# Patient Record
Sex: Female | Born: 2000 | Race: Black or African American | Hispanic: No | Marital: Single | Smoking: Never smoker
Health system: Southern US, Community
[De-identification: ages and names within clinical notes are randomized; demographics above are authoritative.]

## PROBLEM LIST (undated history)

## (undated) DIAGNOSIS — J45909 Unspecified asthma, uncomplicated: Secondary | ICD-10-CM

## (undated) DIAGNOSIS — K602 Anal fissure, unspecified: Secondary | ICD-10-CM

## (undated) DIAGNOSIS — T7840XA Allergy, unspecified, initial encounter: Secondary | ICD-10-CM

## (undated) DIAGNOSIS — Z862 Personal history of diseases of the blood and blood-forming organs and certain disorders involving the immune mechanism: Secondary | ICD-10-CM

## (undated) DIAGNOSIS — J219 Acute bronchiolitis, unspecified: Secondary | ICD-10-CM

## (undated) HISTORY — DX: Personal history of diseases of the blood and blood-forming organs and certain disorders involving the immune mechanism: Z86.2

## (undated) HISTORY — DX: Allergy, unspecified, initial encounter: T78.40XA

## (undated) HISTORY — DX: Anal fissure, unspecified: K60.2

## (undated) HISTORY — DX: Unspecified asthma, uncomplicated: J45.909

## (undated) HISTORY — DX: Acute bronchiolitis, unspecified: J21.9

---
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
## 2021-01-02 ENCOUNTER — Other Ambulatory Visit: Payer: Self-pay | Admitting: Internal Medicine

## 2021-03-06 ENCOUNTER — Other Ambulatory Visit: Payer: Self-pay

## 2021-03-07 ENCOUNTER — Ambulatory Visit (INDEPENDENT_AMBULATORY_CARE_PROVIDER_SITE_OTHER): Payer: No Typology Code available for payment source | Admitting: Internal Medicine

## 2021-03-07 ENCOUNTER — Encounter: Payer: Self-pay | Admitting: Internal Medicine

## 2021-03-07 VITALS — BP 122/68 | HR 76 | Temp 97.3°F | Ht 64.81 in | Wt 126.4 lb

## 2021-03-07 DIAGNOSIS — R6889 Other general symptoms and signs: Secondary | ICD-10-CM

## 2021-03-07 DIAGNOSIS — R899 Unspecified abnormal finding in specimens from other organs, systems and tissues: Secondary | ICD-10-CM

## 2021-03-07 NOTE — Progress Notes (Signed)
Chief Complaint  Patient presents with  . Results    HPI: Andrea Odonnell 20 y.o. come in  With mom and younger sibs for review of lab tests that were done in June 2021 at [facility] for her sports physical. She is generally well but had some minor lab abnormalities and needed follow-up mom was concerned.  States that she is cold a lot of the time.  Last visit 2020 with me She is a senior in high school had positive Covid November 2020 and then positive earlier this year but no illness tested because she was on the team had to be quarantined for 10 days exposed to another player. Never got sick.  Also known to have had the Pfizer vaccine last April. Had covid 19 test pos 11 2020 ( family also pos)  ROS: See pertinent positives and negatives per HPI.  No chest pain shortness of breath periods are normal no unusual Sleep is good no exercise intolerance.  At this time.  Past Medical History:  Diagnosis Date  . Allergy   . Anal fissure    hx as a infant andtoddler  . ASTHMA 02/16/2009  . Bronchiolitis    hosp age 64 months   . Constipation    as an infant  gi consults  . Hx of sickle cell trait     History reviewed. No pertinent family history.  Social History   Socioeconomic History  . Marital status: Single    Spouse name: Not on file  . Number of children: Not on file  . Years of education: Not on file  . Highest education level: Not on file  Occupational History  . Not on file  Tobacco Use  . Smoking status: Never Smoker  . Smokeless tobacco: Never Used  Substance and Sexual Activity  . Alcohol use: No  . Drug use: No  . Sexual activity: Never  Other Topics Concern  . Not on file  Social History Narrative   hh of  5 + sis in collegeJasmine   [employer] 5th grade    7th grade  Does well in school    Picky eater   9th grade [employer]    Plays basket ball.    Father smokes  Outside    Social Determinants of Health   Financial Resource Strain: Not on file  Food  Insecurity: Not on file  Transportation Needs: Not on file  Physical Activity: Not on file  Stress: Not on file  Social Connections: Not on file    Outpatient Medications Prior to Visit  Medication Sig Dispense Refill  . Adapalene 0.3 % gel APPLY A SMALL AMOUNT TO AFFECTED AREA AT BEDTIME    . albuterol (VENTOLIN HFA) 108 (90 Base) MCG/ACT inhaler INHALE 2 PUFFS EVERY 6 HOURS AS NEEDED FOR WHEEZING OR 15-30 MINUTES PRIOR TO EXERCISE. 6.7 g 0  . CLINDACIN ETZ 1 % SWAB SMARTSIG:1 Swab Topical Every Morning    . mupirocin ointment (BACTROBAN) 2 % Apply to area tid 15 g 1   No facility-administered medications prior to visit.     EXAM:  BP 122/68 (BP Location: Left Arm, Patient Position: Sitting, Cuff Size: Normal)   Pulse 76   Temp (!) 97.3 F (36.3 C) (Oral)   Ht 5' 4.81" (1.646 m)   Wt 126 lb 6.4 oz (57.3 kg)   LMP 02/26/2021   SpO2 99%   BMI 21.16 kg/m   Body mass index is 21.16 kg/m.  GENERAL: vitals reviewed and listed  above, alert, oriented, appears well hydrated and in no acute distress HEENT: atraumatic, conjunctiva  clear, no obvious abnormalities on inspection of external nose and ears OP : masked NECK: no obvious masses on inspection palpation  LUNGS: clear to auscultation bilaterally, no wheezes, rales or rhonchi, good air movement CV: HRRR, no clubbing cyanosis or  peripheral edema nl cap refill  Abdomen:  Sof,t normal bowel sounds without hepatosplenomegaly, no guarding rebound or masses no CVA tenderness MS: moves all extremities without noticeable focal  abnormality PSYCH: pleasant and cooperative, no obvious depression or anxiety No results found for: WBC, HGB, HCT, PLT, GLUCOSE, CHOL, TRIG, HDL, LDLDIRECT, LDLCALC, ALT, AST, NA, K, CL, CREATININE, BUN, CO2, TSH, PSA, INR, GLUF, HGBA1C, MICROALBUR BP Readings from Last 3 Encounters:  03/07/21 122/68  11/02/16 106/60 (44 %, Z = -0.15 /  33 %, Z = -0.44)*  02/17/16 120/60   *BP percentiles are based on the  2017 AAP Clinical Practice Guideline for girls  Review of laboratory studies shows negative HIV positive antibody Covid hemoglobin 12.9 MCV 90 platelets 256 MPV 11.1 slightly elevated normal differential blood glucose 82 creatinine 0.7 liver panel normal total cholesterol 128 triglycerides 35 HDL 55 LDL 66 Lab results will be sent to scan and abstract. ASSESSMENT AND PLAN:  Discussed the following assessment and plan:  Cold intolerance - Plan: CBC with Differential/Platelet, TSH, T4, free, IBC + Ferritin, IBC + Ferritin, CBC with Differential/Platelet, TSH, T4, free  Abnormal laboratory test result Doubt significance of borderline elevated MPV 11.1 without other abnormalities Check thyroid iron levels basically her exam is normal Briefly discussed immunizations may need to be updated   -Patient advised to return or notify health care team  if  new concerns arise.  Patient Instructions   Checking for  iron levels and thyroid today  .  Can cause cold intolerance .   You may be due for second meningitis vaccine and other  HPV .     Neta Mends. Andrea Odonnell M.D.

## 2021-03-07 NOTE — Patient Instructions (Signed)
  Checking for  iron levels and thyroid today  .  Can cause cold intolerance .   You may be due for second meningitis vaccine and other  HPV .

## 2021-03-08 LAB — CBC WITH DIFFERENTIAL/PLATELET
Basophils Absolute: 0.1 10*3/uL (ref 0.0–0.1)
Basophils Relative: 1.2 % (ref 0.0–3.0)
Eosinophils Absolute: 0.1 10*3/uL (ref 0.0–0.7)
Eosinophils Relative: 3 % (ref 0.0–5.0)
HCT: 38.2 % (ref 36.0–49.0)
Hemoglobin: 12.9 g/dL (ref 12.0–16.0)
Lymphocytes Relative: 38.2 % (ref 24.0–48.0)
Lymphs Abs: 1.7 10*3/uL (ref 0.7–4.0)
MCHC: 33.7 g/dL (ref 31.0–37.0)
MCV: 88.9 fl (ref 78.0–98.0)
Monocytes Absolute: 0.4 10*3/uL (ref 0.1–1.0)
Monocytes Relative: 9.9 % (ref 3.0–12.0)
Neutro Abs: 2.1 10*3/uL (ref 1.4–7.7)
Neutrophils Relative %: 47.7 % (ref 43.0–71.0)
Platelets: 244 10*3/uL (ref 150.0–575.0)
RBC: 4.3 Mil/uL (ref 3.80–5.70)
RDW: 14.5 % (ref 11.4–15.5)
WBC: 4.4 10*3/uL — ABNORMAL LOW (ref 4.5–13.5)

## 2021-03-08 LAB — T4, FREE: Free T4: 0.86 ng/dL (ref 0.60–1.60)

## 2021-03-08 LAB — IBC + FERRITIN
Ferritin: 14.7 ng/mL (ref 10.0–291.0)
Iron: 32 ug/dL — ABNORMAL LOW (ref 42–145)
Saturation Ratios: 6.5 % — ABNORMAL LOW (ref 20.0–50.0)
Transferrin: 352 mg/dL (ref 212.0–360.0)

## 2021-03-08 LAB — TSH: TSH: 1.4 u[IU]/mL (ref 0.40–5.00)

## 2021-03-08 NOTE — Progress Notes (Signed)
Repeat blood count is normal but iron levels are slightly low so you do have iron deficiency thyroid is normal.  You need to take an iron pill supplement every day as tolerated ferrous sulfate is the most common type of supplement.Iif it bothers your stomach take it every other day.  I would do this for 2 to 3 months and if not feeling better will do another lab test.  Otherwise can stop after 3 months and take just during periods.

## 2021-05-17 ENCOUNTER — Encounter: Payer: Self-pay | Admitting: Internal Medicine

## 2021-06-07 ENCOUNTER — Telehealth: Payer: Self-pay | Admitting: Internal Medicine

## 2021-06-07 NOTE — Telephone Encounter (Signed)
Patient mother is calling and wanted to see if provider can write a letter stating that patient has a sickle cell trait and does not need to restest again at school to participate in sports. Mother is aware that provider is out of the office and letter may or may not be written until she returns. CB is [phone]

## 2021-06-11 NOTE — Telephone Encounter (Signed)
Either give mom a copy of the sickle trait  lab results  if you can find it  in the record  or can   write her a letter  that says she has has sickle trait     It appears she has had a sports physical with another provider recently .  At urgent care   they could also write a letter .  If needed

## 2021-06-12 ENCOUNTER — Encounter: Payer: Self-pay | Admitting: Internal Medicine

## 2021-06-12 NOTE — Telephone Encounter (Signed)
I spoke with the patient's mother and she stated she needed a copy of the original labs in which patient had done stating the patient has sickle cell trait. I informed patient's mother she may need to contact medical records for this information. Per patient request immunization records have been placed up front for patient to pick up at earliest convenience.

## 2021-07-13 ENCOUNTER — Telehealth: Payer: Self-pay | Admitting: Internal Medicine

## 2021-07-14 NOTE — Telephone Encounter (Signed)
The patient's mother called requesting a refill for the patient. The patient is in a summer [place] in [place] and needs a refill on her albuterol (VENTOLIN HFA) 108 (90 Base) MCG/ACT inhaler  The mother stated that the patient was just seen in March 2022 by Dr. Fabian Sharp and doesn't understand why there is a note to the pharmacy that states that the patient needs an appointment for further refills.  Please advise

## 2021-07-17 MED ORDER — ALBUTEROL SULFATE HFA 108 (90 BASE) MCG/ACT IN AERS: INHALATION_SPRAY | RESPIRATORY_TRACT | 1 refills | Status: DC

## 2021-07-17 NOTE — Telephone Encounter (Signed)
RX sent to pt's local pharmacy.

## 2021-07-17 NOTE — Addendum Note (Signed)
Addended by: Christy Sartorius on: 07/17/2021 03:48 PM   Modules accepted: Orders

## 2021-07-17 NOTE — Telephone Encounter (Signed)
Left a message for the pt tor return my call.

## 2021-11-19 ENCOUNTER — Other Ambulatory Visit: Payer: Self-pay | Admitting: Internal Medicine

## 2021-12-28 ENCOUNTER — Encounter: Payer: Self-pay | Admitting: Internal Medicine

## 2021-12-28 ENCOUNTER — Telehealth (INDEPENDENT_AMBULATORY_CARE_PROVIDER_SITE_OTHER): Payer: Self-pay | Admitting: Internal Medicine

## 2021-12-28 DIAGNOSIS — R21 Rash and other nonspecific skin eruption: Secondary | ICD-10-CM

## 2021-12-28 MED ORDER — TRIAMCINOLONE ACETONIDE 0.1 % EX CREA: 1.0000 "application " | TOPICAL_CREAM | Freq: Two times a day (BID) | CUTANEOUS | 1 refills | Status: DC

## 2021-12-28 NOTE — Progress Notes (Signed)
Virtual Visit via Video Note  I connected with Andrea Odonnell n 12/28/21 at 10:00 AM EST by a video enabled telemedicine application and verified that I am speaking with the correct person using two identifiers. Location patient: home Location provider:work or home office Persons participating in the virtual visit: patient, provider and mom   [place] national recommendations  regarding COVID 19 pandemic   video visit is advised over in office visit for this patient.  Patient aware  of the limitations of evaluation and management by telemedicine and  availability of in person appointments. and agreed to proceed.   HPI: Andrea Odonnell presents for video visit because of 4-day history of a rash along her left arm not involving her hand or palm.  Used hydrocortisone twice daily with some fading but when not using it comes back.  No history of same only change would be a scented Dove instead of a regular Dove soap it is not on the rest of her body and there is no systemic symptoms.  She did have a history of allergies especially when younger.  She uses Nivea for moisturizing.  She is right-handed  benadryl no help ROS: See pertinent positives and negatives per HPI.  Past Medical History:  Diagnosis Date   Allergy    Anal fissure    hx as a infant andtoddler   ASTHMA 02/16/2009   Bronchiolitis    hosp age 88 months    Constipation    as an infant  gi consults   Hx of sickle cell trait     No past surgical history on file.  No family history on file.  Social History   Tobacco Use   Smoking status: Never   Smokeless tobacco: Never  Substance Use Topics   Alcohol use: No   Drug use: No      Current Outpatient Medications:    Adapalene 0.3 % gel, APPLY A SMALL AMOUNT TO AFFECTED AREA AT BEDTIME, Disp: , Rfl:    albuterol (VENTOLIN HFA) 108 (90 Base) MCG/ACT inhaler, INHALE 2 PUFFS EVERY 6 HOURS AS NEEDED FOR WHEEZING OR 15-30 MINUTES PRIOR TO EXERCISE., Disp: 8.5 each, Rfl: 1    CLINDACIN ETZ 1 % SWAB, SMARTSIG:1 Swab Topical Every Morning, Disp: , Rfl:    mupirocin ointment (BACTROBAN) 2 %, Apply to area tid, Disp: 15 g, Rfl: 1   triamcinolone cream (KENALOG) 0.1 %, Apply 1 application topically 2 (two) times daily. To arm rash, Disp: 45 g, Rfl: 1  EXAM: BP Readings from Last 3 Encounters:  03/07/21 122/68  11/02/16 106/60 (44 %, Z = -0.15 /  33 %, Z = -0.44)*  02/17/16 120/60   *BP percentiles are based on the 2017 AAP Clinical Practice Guideline for girls    VITALS per patient if applicable:  GENERAL: alert, oriented, appears well and in no acute distress  HEENT: atraumatic, conjunttiva clear, no obvious abnormalities on inspection of external nose and ears  NECK: normal movements of the head and neck  LUNGS: on inspection no signs of respiratory distress, breathing rate appears normal, no obvious gross SOB, gasping or wheezing  CV: no obvious cyanosis  MS: moves all visible extremities without noticeable abnormality Left arm medial surface been gaining proximal to distal diffuse papular rash without obvious hives no vesicle is in the medial arm none external extensors urface in palm and hand are normal right arm is normal.  PSYCH/NEURO: pleasant and cooperative, no obvious depression or anxiety, speech and thought processing grossly intact  Lab Results  Component Value Date   WBC 4.4 (L) 03/07/2021   HGB 12.9 03/07/2021   HCT 38.2 03/07/2021   PLT 244.0 03/07/2021   CHOL 128 05/31/2020   TRIG 35 (A) 05/31/2020   HDL 55 05/31/2020   LDLCALC 66 05/31/2020   NA 137 05/31/2020   K 4.3 05/31/2020   CL 104 05/31/2020   TSH 1.40 03/07/2021    ASSESSMENT AND PLAN:  Discussed the following assessment and plan:    ICD-10-CM   1. Rash  R21    left medial arm    Uncertain cause consider a topical or contact type of problem because of the location although no history of same and it does not itch as much as 1 would think.  However will plan more  potent steroid twice daily for a week and avoid scented products new products keep cool evaluate if ongoing. Send in picture to my chart for documentation.  Counseled.   Expectant management and discussion of plan and treatment with opportunity to ask questions and all were answered. The patient agreed with the plan and demonstrated an understanding of the instructions.   Advised to call back or seek an in-person evaluation if worsening  or having  further concerns  in interim. Return if symptoms worsen or fail to improve sa expected.    Berniece Andreas, MD

## 2022-04-22 ENCOUNTER — Other Ambulatory Visit: Payer: Self-pay

## 2022-04-22 ENCOUNTER — Encounter: Payer: Self-pay | Admitting: Emergency Medicine

## 2022-04-22 ENCOUNTER — Emergency Department: Payer: No Typology Code available for payment source

## 2022-04-22 ENCOUNTER — Emergency Department
Admission: EM | Admit: 2022-04-22 | Discharge: 2022-04-22 | Disposition: A | Discharge status: Home / Self Care | Payer: No Typology Code available for payment source | Attending: Emergency Medicine | Admitting: Emergency Medicine

## 2022-04-22 DIAGNOSIS — R1031 Right lower quadrant pain: Secondary | ICD-10-CM

## 2022-04-22 DIAGNOSIS — N390 Urinary tract infection, site not specified: Secondary | ICD-10-CM

## 2022-04-22 DIAGNOSIS — R1030 Lower abdominal pain, unspecified: Secondary | ICD-10-CM

## 2022-04-22 LAB — URINALYSIS, ROUTINE W REFLEX MICROSCOPIC
Bilirubin Urine: NEGATIVE
Glucose, UA: NEGATIVE mg/dL
Ketones, ur: NEGATIVE mg/dL
Nitrite: NEGATIVE
Protein, ur: 30 mg/dL — AB
Specific Gravity, Urine: 1.009 (ref 1.005–1.030)
WBC, UA: >50 WBC/hpf — ABNORMAL HIGH (ref 0–5)
pH: 7 (ref 5.0–8.0)

## 2022-04-22 LAB — WET PREP, GENITAL
Clue Cells Wet Prep HPF POC: NONE SEEN
Sperm: NONE SEEN
Trich, Wet Prep: NONE SEEN
WBC, Wet Prep HPF POC: >=10 — AB (ref <10)
Yeast Wet Prep HPF POC: NONE SEEN

## 2022-04-22 LAB — CBC WITH DIFFERENTIAL/PLATELET
Abs Immature Granulocytes: 0.07 10*3/uL (ref 0.00–0.07)
Basophils Absolute: 0 10*3/uL (ref 0.0–0.1)
Basophils Relative: 0 %
Eosinophils Absolute: 0 10*3/uL (ref 0.0–0.5)
Eosinophils Relative: 0 %
HCT: 38.3 % (ref 36.0–46.0)
Hemoglobin: 13.3 g/dL (ref 12.0–15.0)
Immature Granulocytes: 1 %
Lymphocytes Relative: 8 %
Lymphs Abs: 0.9 10*3/uL (ref 0.7–4.0)
MCH: 30.6 pg (ref 26.0–34.0)
MCHC: 34.7 g/dL (ref 30.0–36.0)
MCV: 88.2 fL (ref 80.0–100.0)
Monocytes Absolute: 1.8 10*3/uL — ABNORMAL HIGH (ref 0.1–1.0)
Monocytes Relative: 16 %
Neutro Abs: 8.6 10*3/uL — ABNORMAL HIGH (ref 1.7–7.7)
Neutrophils Relative %: 75 %
Platelets: 249 10*3/uL (ref 150–400)
RBC: 4.34 MIL/uL (ref 3.87–5.11)
RDW: 11.7 % (ref 11.5–15.5)
WBC: 11.5 10*3/uL — ABNORMAL HIGH (ref 4.0–10.5)
nRBC: 0 % (ref 0.0–0.2)

## 2022-04-22 LAB — COMPREHENSIVE METABOLIC PANEL
ALT: 9 U/L (ref 0–44)
AST: 17 U/L (ref 15–41)
Albumin: 4.5 g/dL (ref 3.5–5.0)
Alkaline Phosphatase: 57 U/L (ref 38–126)
Anion gap: 7 (ref 5–15)
BUN: 11 mg/dL (ref 6–20)
CO2: 25 mmol/L (ref 22–32)
Calcium: 9.6 mg/dL (ref 8.9–10.3)
Chloride: 103 mmol/L (ref 98–111)
Creatinine, Ser: 0.74 mg/dL (ref 0.44–1.00)
GFR, Estimated: >60 mL/min (ref >60)
Glucose, Bld: 104 mg/dL — ABNORMAL HIGH (ref 70–99)
Potassium: 3.3 mmol/L — ABNORMAL LOW (ref 3.5–5.1)
Sodium: 135 mmol/L (ref 135–145)
Total Bilirubin: 1.1 mg/dL (ref 0.3–1.2)
Total Protein: 8.4 g/dL — ABNORMAL HIGH (ref 6.5–8.1)

## 2022-04-22 LAB — POC URINE PREG, ED: Preg Test, Ur: NEGATIVE

## 2022-04-22 MED ORDER — KETOROLAC TROMETHAMINE 30 MG/ML IJ SOLN
15.0000 mg | Freq: Once | INTRAMUSCULAR | Status: AC
  Administered 2022-04-22: 15 mg via INTRAVENOUS
  Filled 2022-04-22: qty 1

## 2022-04-22 MED ORDER — ONDANSETRON 4 MG PO TBDP: 4.0000 mg | ORAL_TABLET | Freq: Three times a day (TID) | ORAL | 0 refills | Status: DC | PRN

## 2022-04-22 MED ORDER — ONDANSETRON HCL 4 MG/2ML IJ SOLN
4.0000 mg | Freq: Once | INTRAMUSCULAR | Status: DC
  Filled 2022-04-22: qty 2

## 2022-04-22 MED ORDER — CEFDINIR 300 MG PO CAPS: 300.0000 mg | ORAL_CAPSULE | Freq: Two times a day (BID) | ORAL | 0 refills | Status: AC

## 2022-04-22 MED ORDER — SODIUM CHLORIDE 0.9 % IV BOLUS
1000.0000 mL | Freq: Once | INTRAVENOUS | Status: AC
  Administered 2022-04-22: 1000 mL via INTRAVENOUS

## 2022-04-22 MED ORDER — MORPHINE SULFATE (PF) 4 MG/ML IV SOLN
4.0000 mg | Freq: Once | INTRAVENOUS | Status: DC
  Filled 2022-04-22: qty 1

## 2022-04-22 MED ORDER — SODIUM CHLORIDE 0.9 % IV SOLN
2.0000 g | Freq: Once | INTRAVENOUS | Status: AC
  Administered 2022-04-22: 2 g via INTRAVENOUS
  Filled 2022-04-22: qty 20

## 2022-04-22 MED ORDER — IBUPROFEN 600 MG PO TABS
600.0000 mg | ORAL_TABLET | Freq: Three times a day (TID) | ORAL | 0 refills | Status: DC | PRN
Start: 2022-04-22 — End: 2023-08-15

## 2022-04-22 NOTE — ED Provider Notes (Signed)
?  Physical Exam  ?BP 120/75 (BP Location: Left Arm)   Pulse 72   Temp 98.5 ?F (36.9 ?C) (Oral)   Resp 17   Ht 5\' 4"  (1.626 m)   Wt 57 kg   LMP  (LMP Unknown)   SpO2 100%   BMI 21.57 kg/m?  ? ?Physical Exam ? ?Procedures  ?Procedures ? ?ED Course / MDM  ?  ?Medical Decision Making ?Amount and/or Complexity of Data Reviewed ?Labs: ordered. ?Radiology: ordered. ? ?Risk ?Prescription drug management. ? ? ?Assumed patient care from , MD.  I interviewed patient independently of parent and family members.  Patient states that she has never been sexually active and has absolutely no concerns for STDs.  She declined pelvic exam.  Wet prep showed no signs of BV or yeast.  Pelvic ultrasound showed no evidence of abscess or other acute abnormality.  No evidence of torsion.  Patient was given IV Rocephin in the emergency department and was advised to take Omnicef twice daily.  Return precautions were given to return to the emergency department with new or worsening symptoms.  All patient questions were answered. ? ? ? ? ?  ?Shaune Pollack [place], [place] ?04/22/22 2058 ? ?  ?2059, MD ?04/26/22 1042 ? ?

## 2022-04-22 NOTE — ED Provider Notes (Signed)
? ?[facility] ?Provider Note ? ? ? Event Date/Time  ? First MD Initiated Contact with Patient 04/22/22 1628   ?  (approximate) ? ? ?History  ? ?Abdominal Pain and Back Pain ? ? ?HPI ? ?Andrea Odonnell is a 21 y.o. female here with lower abdominal pain.  The patient states that for the last 2 days, she has had fairly cute onset, and persistent suprapubic and lower abdominal pain.  The pain has been fairly constant.  She states that it is not necessarily positional.  She has noticed some slight increased urinary frequency but no overt dysuria.  Patient denies any vaginal bleeding or discharge.  No dyspareunia.  She is due for her menstrual cycle in the next week, but states this pain feels very different from her usual.  Denies any diarrhea or constipation.  No fevers or chills. ?  ? ? ?Physical Exam  ? ?Triage Vital Signs: ?ED Triage Vitals  ?Enc Vitals Group  ?   BP 04/22/22 1629 129/68  ?   Pulse Rate 04/22/22 1629 100  ?   Resp 04/22/22 1629 19  ?   Temp 04/22/22 1629 99 ?F (37.2 ?C)  ?   Temp src --   ?   SpO2 04/22/22 1629 96 %  ?   Weight 04/22/22 1625 125 lb 10.6 oz (57 kg)  ?   Height 04/22/22 1625 5\' 4"  (1.626 m)  ?   Head Circumference --   ?   Peak Flow --   ?   Pain Score 04/22/22 1625 4  ?   Pain Loc --   ?   Pain Edu? --   ?   Excl. in [place]? --   ? ? ?Most recent vital signs: ?Vitals:  ? 04/22/22 1629 04/22/22 1936  ?BP: 129/68 120/75  ?Pulse: 100 72  ?Resp: 19 17  ?Temp: 99 ?F (37.2 ?C) 98.5 ?F (36.9 ?C)  ?SpO2: 96% 100%  ? ? ? ?General: Awake, no distress.  ?CV:  Good peripheral perfusion.  ?Resp:  Normal effort.  ?Abd:  No distention.  Very minimal suprapubic tenderness, otherwise unremarkable exam with no rebound or guarding.  Specifically, no right lower quadrant tenderness. ?Other:  Moist mucous membranes. ? ? ?ED Results / Procedures / Treatments  ? ?Labs ?(all labs ordered are listed, but only abnormal results are displayed) ?Labs Reviewed  ?WET PREP, GENITAL - Abnormal;  Notable for the following components:  ?    Result Value  ? WBC, Wet Prep HPF POC >=10 (*)   ? All other components within normal limits  ?CBC WITH DIFFERENTIAL/PLATELET - Abnormal; Notable for the following components:  ? WBC 11.5 (*)   ? Neutro Abs 8.6 (*)   ? Monocytes Absolute 1.8 (*)   ? All other components within normal limits  ?COMPREHENSIVE METABOLIC PANEL - Abnormal; Notable for the following components:  ? Potassium 3.3 (*)   ? Glucose, Bld 104 (*)   ? Total Protein 8.4 (*)   ? All other components within normal limits  ?URINALYSIS, ROUTINE W REFLEX MICROSCOPIC - Abnormal; Notable for the following components:  ? Color, Urine YELLOW (*)   ? APPearance HAZY (*)   ? Hgb urine dipstick MODERATE (*)   ? Protein, ur 30 (*)   ? Leukocytes,Ua LARGE (*)   ? WBC, UA >50 (*)   ? Bacteria, UA FEW (*)   ? All other components within normal limits  ?URINE CULTURE  ?CHLAMYDIA/NGC RT PCR ([facility] ONLY)            ?  POC URINE PREG, ED  ? ? ? ?EKG ? ? ? ?RADIOLOGY ?Transvaginal ultrasound:no evidence of torsion on my review ? ? ?I also independently reviewed and agree wit radiologist interpretations. ? ? ?PROCEDURES: ? ?Critical Care performed: No ? ? ?MEDICATIONS ORDERED IN ED: ?Medications  ?morphine (PF) 4 MG/ML injection 4 mg (4 mg Intravenous Not Given 04/22/22 1839)  ?ondansetron [facility]) injection 4 mg (4 mg Intravenous Not Given 04/22/22 1839)  ?sodium chloride 0.9 % bolus 1,000 mL (0 mLs Intravenous Stopped 04/22/22 1942)  ?ketorolac (TORADOL) 30 MG/ML injection 15 mg (15 mg Intravenous Given 04/22/22 1832)  ?cefTRIAXone (ROCEPHIN) 2 g in sodium chloride 0.9 % 100 mL IVPB (0 g Intravenous Stopped 04/22/22 1942)  ? ? ? ?IMPRESSION / MDM / ASSESSMENT AND PLAN / ED COURSE  ?I reviewed the triage vital signs and the nursing notes. ?             ?               ? ? ?Ddx:  ?Dysmenorrhea, UTI, PID, unlikely appendicitis, diverticulitis, colitis, musculoskeletal pain ? ? ?MDM:  ?Well-appearing 21 year old female here with  suprapubic abdominal pain.  Exam is very benign with essentially no tenderness to palpation.  Specifically, she has no right lower quadrant tenderness or signs to suggest appendicitis.  Vital signs are stable.  CBC shows mild leukocytosis, urinalysis is remarkable for significant pyuria.  Suspect UTI, will obtain pelvic ultrasound for evaluation of possible GU etiology.  She has no flank pain, vomiting, fever, or signs to suggest systemic illness/pyelonephritis. ? ?Current plan is to follow-up ultrasound, CMP, and pelvic exam results.  Suspect patient can likely managed as an outpatient.  She feels improved with IV fluids and antiemetics.  ? ? ?MEDICATIONS GIVEN IN ED: ?Medications  ?morphine (PF) 4 MG/ML injection 4 mg (4 mg Intravenous Not Given 04/22/22 1839)  ?ondansetron [facility]) injection 4 mg (4 mg Intravenous Not Given 04/22/22 1839)  ?sodium chloride 0.9 % bolus 1,000 mL (0 mLs Intravenous Stopped 04/22/22 1942)  ?ketorolac (TORADOL) 30 MG/ML injection 15 mg (15 mg Intravenous Given 04/22/22 1832)  ?cefTRIAXone (ROCEPHIN) 2 g in sodium chloride 0.9 % 100 mL IVPB (0 g Intravenous Stopped 04/22/22 1942)  ? ? ? ?Consults:  ?None ? ? ?EMR reviewed  ? ? ? ? ? ?FINAL CLINICAL IMPRESSION(S) / ED DIAGNOSES  ? ?Final diagnoses:  ?Lower urinary tract infectious disease  ?Lower abdominal pain  ? ? ? ?Rx / DC Orders  ? ?ED Discharge Orders   ? ?      Ordered  ?  cefdinir (OMNICEF) 300 MG capsule  2 times daily       ? 04/22/22 1800  ?  ondansetron (ZOFRAN-ODT) 4 MG disintegrating tablet  Every 8 hours PRN       ? 04/22/22 1800  ?  ibuprofen (ADVIL) 600 MG tablet  Every 8 hours PRN       ? 04/22/22 1800  ? ?  ?  ? ?  ? ? ? ?Note:  This document was prepared using Dragon voice recognition software and may include unintentional dictation errors. ?  ?Duffy Bruce, MD ?04/22/22 2004 ? ?

## 2022-04-22 NOTE — ED Triage Notes (Signed)
Pt reports lower abd cramping and lower back pain since yesterday. Pt denies NVD, denies SOB, denies recent injuries, denies all other sx's. Pt unsure of LMP. ?

## 2022-04-22 NOTE — Discharge Instructions (Addendum)
Take Omnicef twice daily for the next 7 days. ?Return if your symptoms seem to be worsening at home. ?

## 2022-04-23 ENCOUNTER — Telehealth: Payer: Self-pay | Admitting: Internal Medicine

## 2022-04-23 LAB — CHLAMYDIA/NGC RT PCR (ARMC ONLY)
Chlamydia Tr: NOT DETECTED
N gonorrhoeae: NOT DETECTED

## 2022-04-23 NOTE — Telephone Encounter (Signed)
Patient wants to know if she can open the cefdinir (OMNICEF) 300 MG capsule and take powered as she is having a problem swallowing it. If this cannot be done then an alternative prescription that is easier to swallow is requested. ? ? ? ? ?Please send to  ?[id number] [place], [place] - [address] Phone:  [phone]  ?Fax:  [phone]  ?  ? ? ? ? ? ? ? ?Please advise  ?

## 2022-04-24 NOTE — Telephone Encounter (Signed)
LVM instructions for pt to call back regarding the medication she was prescribed & having trouble taking. Pt can also check with pharmacist to see if medication can be opened & taken that way; can also request liquid form or ask PCP for alternative. ?

## 2022-04-25 LAB — URINE CULTURE: Culture: >=100000 — AB

## 2022-11-18 IMAGING — US US ART/VEN ABD/PELV/SCROTUM DOPPLER LTD
1 series · 14 of 25 positions shown · non-contrast
Comparison: None.

CLINICAL DATA: Pelvic pain

EXAM:
TRANSABDOMINAL ULTRASOUND OF PELVIS
DOPPLER ULTRASOUND OF OVARIES
TECHNIQUE: Transabdominal ultrasound examination of the pelvis was performed
including evaluation of the uterus, ovaries, adnexal regions, and
pelvic cul-de-sac.
Color and duplex Doppler ultrasound was utilized to evaluate blood
flow to the ovaries.

[Series 1: us pelvic complete w transvaginal and torsion righ · 14 of 88 slices shown]
[im 1/88]
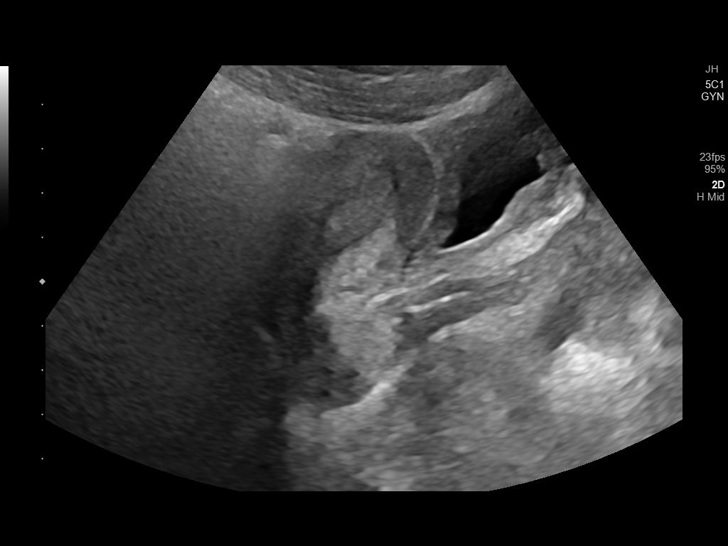
[im 8/88]
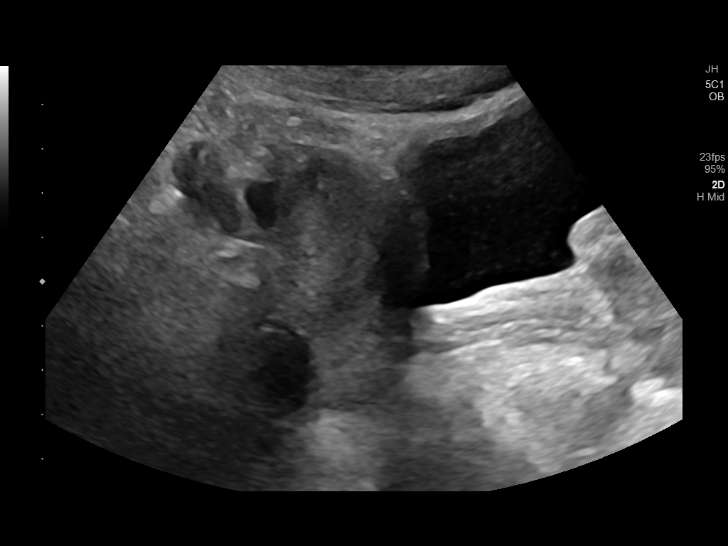
[im 15/88]
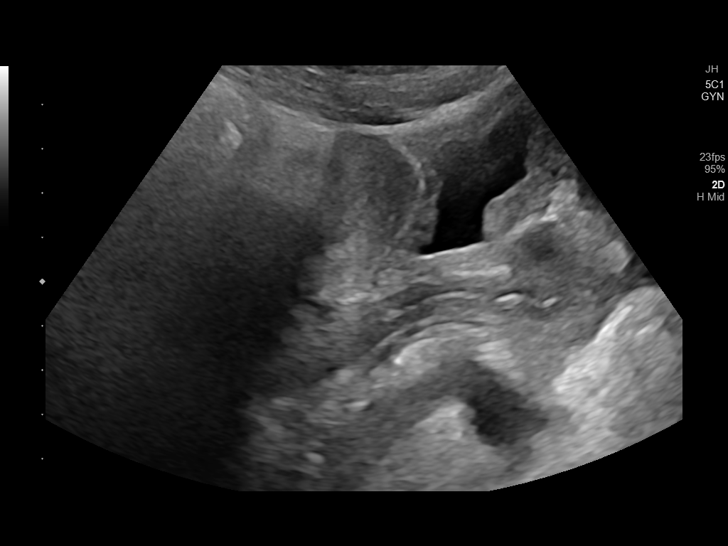
[im 22/88]
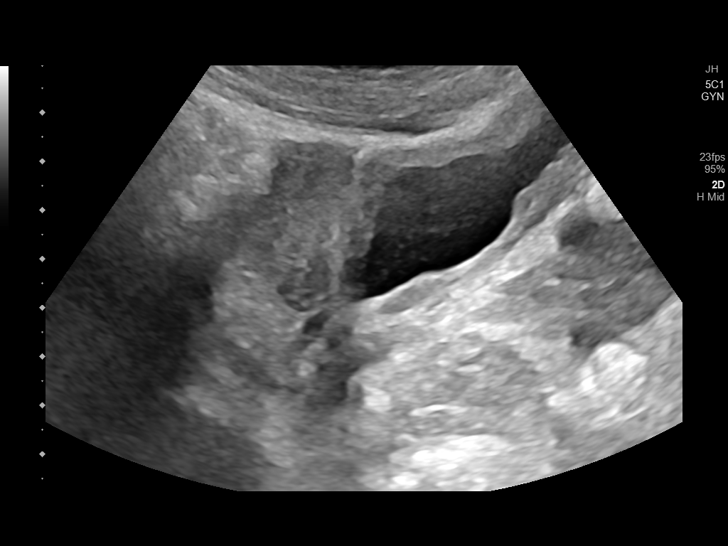
[im 30/88]
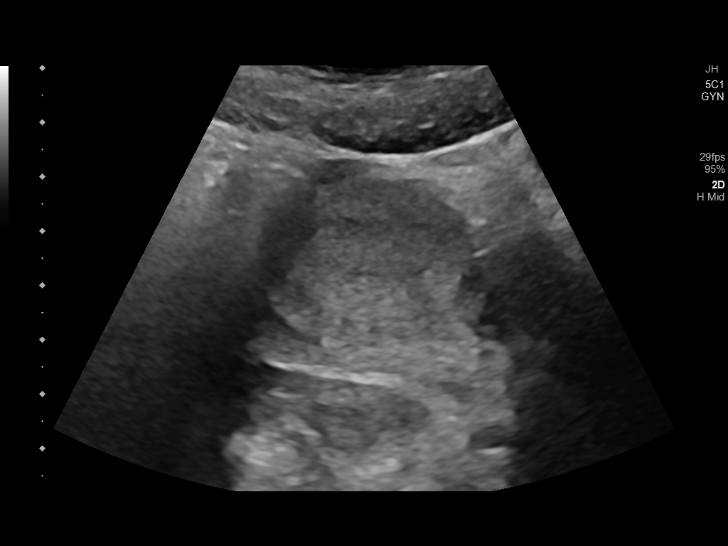
[im 33/88]
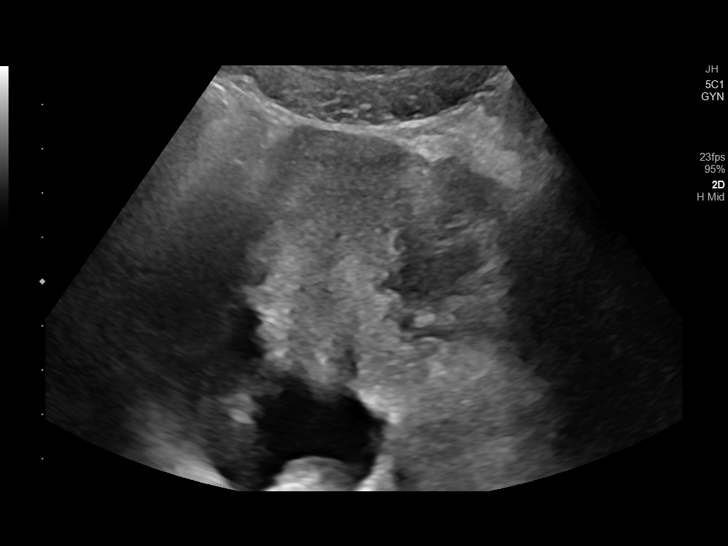
[im 40/88]
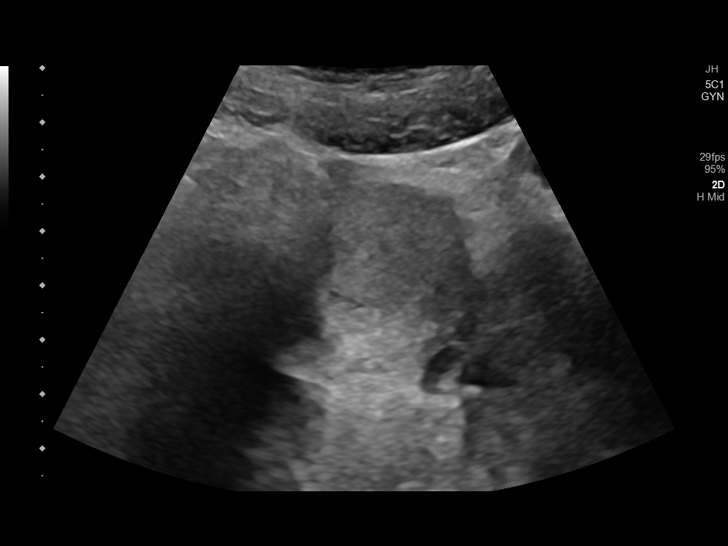
[im 48/88]
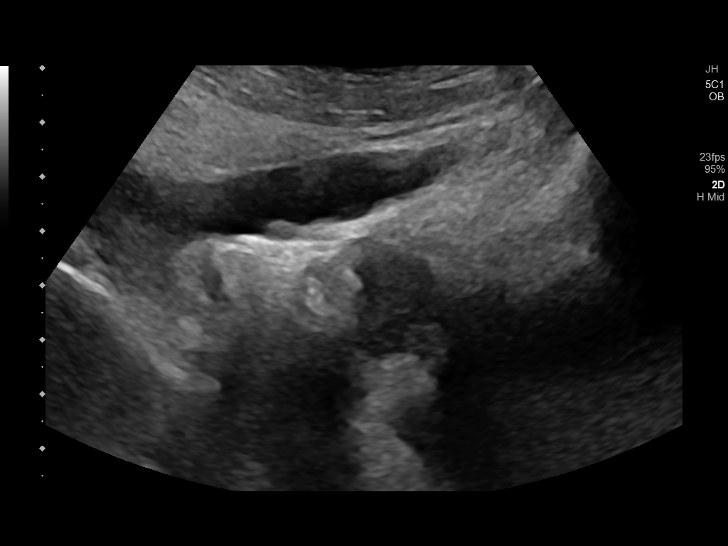
[im 55/88]
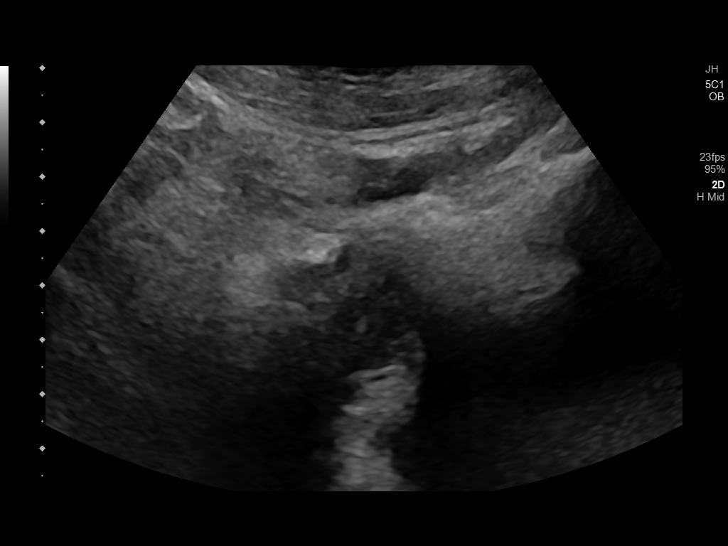
[im 59/88]
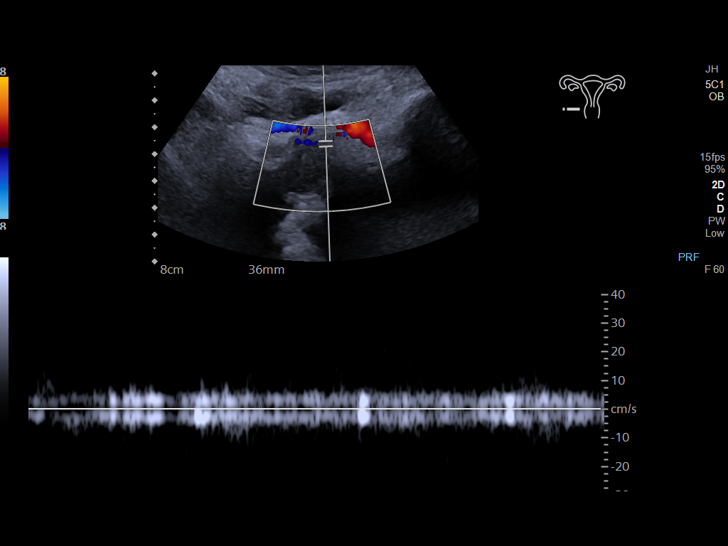
[im 66/88]
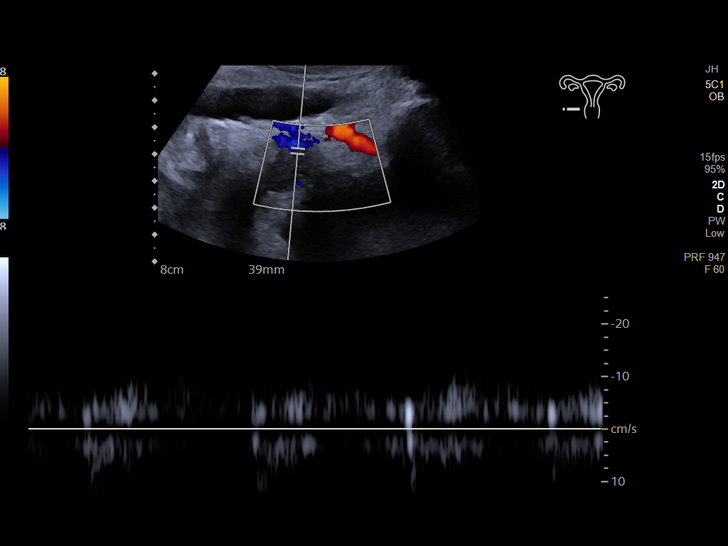
[im 73/88]
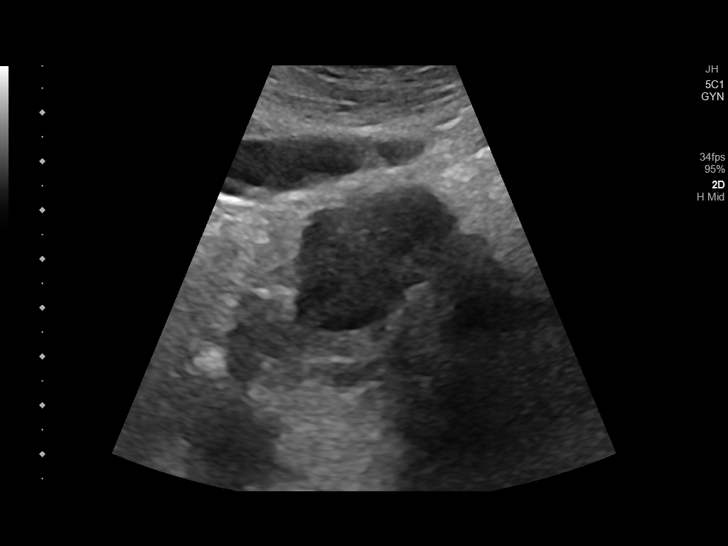
[im 80/88]
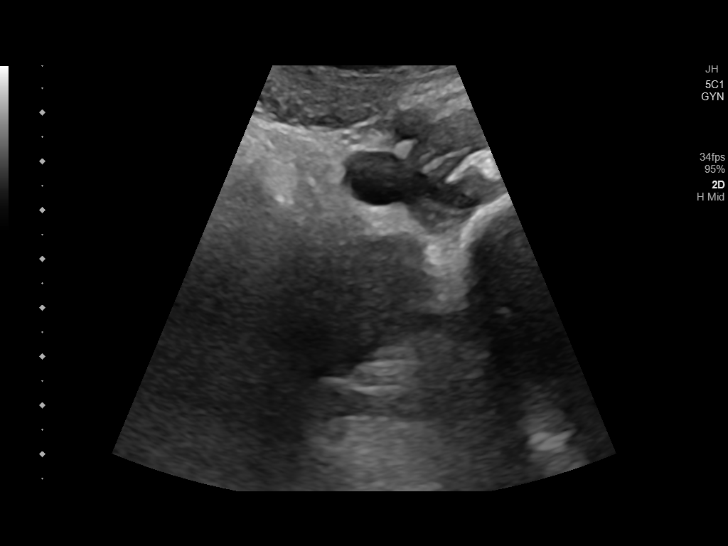
[im 88/88]
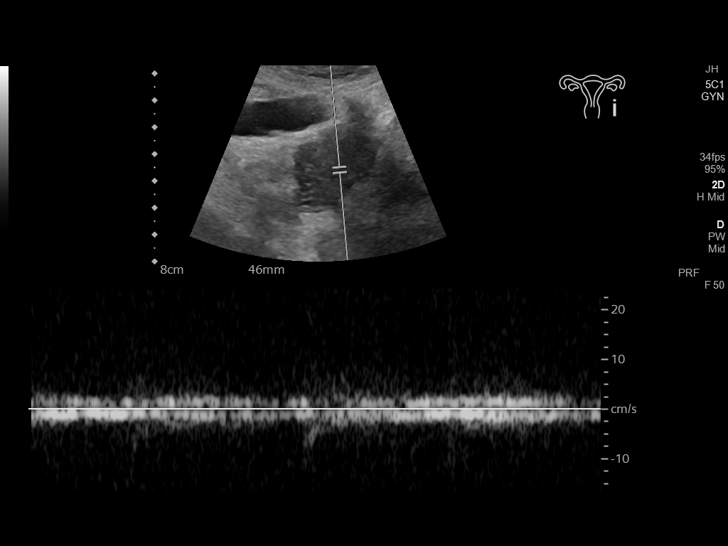

[14 of 25 positions shown; findings below may reference images not displayed]

FINDINGS: Uterus

Measurements: 6.9 x 3 x 4.1 cm = volume: 45 mL. No fibroids or other
mass visualized.

Endometrium

Thickness: 7 mm.  No focal abnormality visualized.

Right ovary

Measurements: 2.3 x 1.9 x 1.4 cm = volume: 3.2 mL. Normal
appearance/no adnexal mass.

Left ovary

Measurements: 3.4 x 3 x 2 cm = volume: 10.6 mL. Normal appearance/no
adnexal mass.

Pulsed Doppler evaluation demonstrates normal low-resistance
arterial and venous waveforms in both ovaries.

Other: Small free fluid
IMPRESSION: 1. Negative for ovarian torsion or ovarian mass.
2. Small free fluid in the pelvis

## 2023-06-17 DIAGNOSIS — S76012D Strain of muscle, fascia and tendon of left hip, subsequent encounter: Secondary | ICD-10-CM | POA: Diagnosis not present

## 2023-06-19 DIAGNOSIS — S76012D Strain of muscle, fascia and tendon of left hip, subsequent encounter: Secondary | ICD-10-CM | POA: Diagnosis not present

## 2023-06-24 DIAGNOSIS — S76012D Strain of muscle, fascia and tendon of left hip, subsequent encounter: Secondary | ICD-10-CM | POA: Diagnosis not present

## 2023-06-26 DIAGNOSIS — S76012D Strain of muscle, fascia and tendon of left hip, subsequent encounter: Secondary | ICD-10-CM | POA: Diagnosis not present

## 2023-07-01 DIAGNOSIS — S76012D Strain of muscle, fascia and tendon of left hip, subsequent encounter: Secondary | ICD-10-CM | POA: Diagnosis not present

## 2023-07-03 DIAGNOSIS — S76012D Strain of muscle, fascia and tendon of left hip, subsequent encounter: Secondary | ICD-10-CM | POA: Diagnosis not present

## 2023-07-08 DIAGNOSIS — S76012D Strain of muscle, fascia and tendon of left hip, subsequent encounter: Secondary | ICD-10-CM | POA: Diagnosis not present

## 2023-07-10 DIAGNOSIS — S76012D Strain of muscle, fascia and tendon of left hip, subsequent encounter: Secondary | ICD-10-CM | POA: Diagnosis not present

## 2023-07-15 DIAGNOSIS — S76012D Strain of muscle, fascia and tendon of left hip, subsequent encounter: Secondary | ICD-10-CM | POA: Diagnosis not present

## 2023-07-22 DIAGNOSIS — S76012D Strain of muscle, fascia and tendon of left hip, subsequent encounter: Secondary | ICD-10-CM | POA: Diagnosis not present

## 2023-08-15 ENCOUNTER — Encounter: Payer: Self-pay | Admitting: Internal Medicine

## 2023-08-15 ENCOUNTER — Telehealth (INDEPENDENT_AMBULATORY_CARE_PROVIDER_SITE_OTHER): Payer: BC Managed Care – PPO | Admitting: Internal Medicine

## 2023-08-15 VITALS — Ht 66.0 in | Wt 140.0 lb

## 2023-08-15 DIAGNOSIS — J45990 Exercise induced bronchospasm: Secondary | ICD-10-CM

## 2023-08-15 DIAGNOSIS — Z79899 Other long term (current) drug therapy: Secondary | ICD-10-CM | POA: Diagnosis not present

## 2023-08-15 MED ORDER — ALBUTEROL SULFATE HFA 108 (90 BASE) MCG/ACT IN AERS: INHALATION_SPRAY | RESPIRATORY_TRACT | 3 refills | Status: DC

## 2023-08-15 NOTE — Progress Notes (Addendum)
Virtual Visit via Video Note  I connected with Andrea Odonnell on 08/15/23 at  9:45 AM EDT by a video enabled telemedicine application and verified that I am speaking with the correct person using two identifiers. Location patient: home Location provider:work office Persons participating in the virtual visit: patient, provider  Patient aware  of the limitations of evaluation and management by telemedicine and  availability of in person appointments. and agreed to proceed.   HPI: Andrea Odonnell presents for video visit  huses albuterol pre exercise for EIB but no asthma flares. Takes claritin for allergy sx and controlled. 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 Live on campus. Stays active and no sig  med issues ( knee inj in spring)  Had CPE in [place] in July 31  and all ok   NOw lives in [place]  hh of 6 no animal exposure.  Periods ok normal no concerns   ROS: See pertinent positives and negatives per HPI.  Past Medical History:  Diagnosis Date   Allergy    Anal fissure    hx as a infant andtoddler   ASTHMA 02/16/2009   Bronchiolitis    hosp age 33 months    Constipation    as an infant  gi consults   Hx of sickle cell trait     History reviewed. No pertinent surgical history.  History reviewed. No pertinent family history.  Social History   Tobacco Use   Smoking status: Never   Smokeless tobacco: Never  Substance Use Topics   Alcohol use: No   Drug use: No      Current Outpatient Medications:    Ferrous Sulfate (IRON PO), Take by mouth., Disp: , Rfl:    Loratadine (CLARITIN PO), Take by mouth., Disp: , Rfl:    Adapalene 0.3 % gel, APPLY A SMALL AMOUNT TO AFFECTED AREA AT BEDTIME (Patient not taking: Reported on 08/15/2023), Disp: , Rfl:    albuterol (VENTOLIN HFA) 108 (90 Base) MCG/ACT inhaler, INHALE 2 PUFFS EVERY 6 HOURS AS NEEDED FOR WHEEZING OR 15-30 MINUTES PRIOR TO EXERCISE., Disp: 8.5 each, Rfl: 3    CLINDACIN ETZ 1 % SWAB, SMARTSIG:1 Swab Topical Every Morning (Patient not taking: Reported on 08/15/2023), Disp: , Rfl:    mupirocin ointment (BACTROBAN) 2 %, Apply to area tid (Patient not taking: Reported on 08/15/2023), Disp: 15 g, Rfl: 1  EXAM: BP Readings from Last 3 Encounters:  04/22/22 120/75  03/07/21 122/68  11/02/16 106/60 (44%, Z = -0.15 /  33%, Z = -0.44)*   *BP percentiles are based on the 2017 AAP Clinical Practice Guideline for girls    VITALS per patient if applicable:  GENERAL: alert, oriented, appears well and in no acute distress  HEENT: atraumatic, conjunttiva clear, no obvious abnormalities on inspection of external nose and ears  NECK: normal movements of the head and neck  LUNGS: on inspection no signs of respiratory distress, breathing rate appears normal, no obvious gross SOB, gasping or wheezing  CV: no obvious cyanosis  MS: moves all visible extremities without noticeable abnormality  PSYCH/NEURO: pleasant and cooperative, no obvious depression or anxiety, speech and thought processing grossly intact   ASSESSMENT AND PLAN:  Discussed the following assessment and plan:    ICD-10-CM   1. 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 Medication management  Z79.899      Ok for refills appropriate [place] of medication and no alarm features. Refill  yearly check as indicated . Seek care for  asthma sx ot orther.  Attention to healthy ls  Counseled.   Expectant management and discussion of plan and treatment with opportunity to ask questions and all were answered. The patient agreed with the plan and demonstrated an understanding of the instructions.   Advised to call back or seek an in-person evaluation if worsening  or having  further concerns  in interim. Return in about 1 year (around 08/14/2024) for medication or as indicated .    Berniece Andreas, MD

## 2023-11-24 ENCOUNTER — Other Ambulatory Visit: Payer: Self-pay | Admitting: Internal Medicine
